# Patient Record
Sex: Female | Born: 1995 | Race: White | Hispanic: No | Marital: Single | Smoking: Never smoker
Health system: Southern US, Community
[De-identification: ages and names within clinical notes are randomized; demographics above are authoritative.]

---

## 2018-01-14 ENCOUNTER — Encounter (HOSPITAL_COMMUNITY): Payer: Self-pay | Admitting: Emergency Medicine

## 2018-01-14 ENCOUNTER — Emergency Department (HOSPITAL_COMMUNITY)
Admission: EM | Admit: 2018-01-14 | Discharge: 2018-01-15 | Disposition: A | Discharge status: Home / Self Care | Payer: BLUE CROSS/BLUE SHIELD | Attending: Emergency Medicine | Admitting: Emergency Medicine

## 2018-01-14 ENCOUNTER — Other Ambulatory Visit: Payer: Self-pay

## 2018-01-14 DIAGNOSIS — R102 Pelvic and perineal pain: Secondary | ICD-10-CM | POA: Insufficient documentation

## 2018-01-14 DIAGNOSIS — R1030 Lower abdominal pain, unspecified: Secondary | ICD-10-CM

## 2018-01-14 DIAGNOSIS — R109 Unspecified abdominal pain: Secondary | ICD-10-CM | POA: Diagnosis present

## 2018-01-14 LAB — URINALYSIS, ROUTINE W REFLEX MICROSCOPIC
BILIRUBIN URINE: NEGATIVE
Glucose, UA: NEGATIVE mg/dL
Hgb urine dipstick: NEGATIVE
KETONES UR: NEGATIVE mg/dL
Leukocytes, UA: NEGATIVE
NITRITE: NEGATIVE
PH: 8 (ref 5.0–8.0)
Protein, ur: NEGATIVE mg/dL
Specific Gravity, Urine: 1.012 (ref 1.005–1.030)

## 2018-01-14 LAB — COMPREHENSIVE METABOLIC PANEL
ALBUMIN: 4.3 g/dL (ref 3.5–5.0)
ALK PHOS: 69 U/L (ref 38–126)
ALT: 10 U/L — AB (ref 14–54)
AST: 18 U/L (ref 15–41)
Anion gap: 11 (ref 5–15)
BILIRUBIN TOTAL: 0.9 mg/dL (ref 0.3–1.2)
BUN: 10 mg/dL (ref 6–20)
CALCIUM: 9 mg/dL (ref 8.9–10.3)
CO2: 23 mmol/L (ref 22–32)
CREATININE: 0.67 mg/dL (ref 0.44–1.00)
Chloride: 104 mmol/L (ref 101–111)
GFR calc Af Amer: >60 mL/min (ref >60)
GFR calc non Af Amer: >60 mL/min (ref >60)
GLUCOSE: 113 mg/dL — AB (ref 65–99)
POTASSIUM: 3.5 mmol/L (ref 3.5–5.1)
Sodium: 138 mmol/L (ref 135–145)
TOTAL PROTEIN: 6.8 g/dL (ref 6.5–8.1)

## 2018-01-14 LAB — CBC
HCT: 38.6 % (ref 36.0–46.0)
Hemoglobin: 13.5 g/dL (ref 12.0–15.0)
MCH: 30.7 pg (ref 26.0–34.0)
MCHC: 35 g/dL (ref 30.0–36.0)
MCV: 87.7 fL (ref 78.0–100.0)
PLATELETS: 217 10*3/uL (ref 150–400)
RBC: 4.4 MIL/uL (ref 3.87–5.11)
RDW: 12 % (ref 11.5–15.5)
WBC: 7.2 10*3/uL (ref 4.0–10.5)

## 2018-01-14 LAB — I-STAT BETA HCG BLOOD, ED (MC, WL, AP ONLY): I-stat hCG, quantitative: <5 m[IU]/mL (ref <5)

## 2018-01-14 LAB — LIPASE, BLOOD: Lipase: 35 U/L (ref 11–51)

## 2018-01-14 NOTE — ED Triage Notes (Signed)
Pt reports generalized sharp abdominal pain x2 hours. Denies N/V/D.

## 2018-01-15 ENCOUNTER — Emergency Department (HOSPITAL_COMMUNITY): Payer: BLUE CROSS/BLUE SHIELD

## 2018-01-15 LAB — RPR: RPR: NONREACTIVE

## 2018-01-15 MED ORDER — SODIUM CHLORIDE 0.9 % IV BOLUS (SEPSIS)
1000.0000 mL | Freq: Once | INTRAVENOUS | Status: AC
  Administered 2018-01-15: 1000 mL via INTRAVENOUS

## 2018-01-15 MED ORDER — MORPHINE SULFATE (PF) 4 MG/ML IV SOLN
4.0000 mg | Freq: Once | INTRAVENOUS | Status: AC
  Administered 2018-01-15: 4 mg via INTRAVENOUS
  Filled 2018-01-15: qty 1

## 2018-01-15 MED ORDER — ONDANSETRON HCL 4 MG/2ML IJ SOLN
4.0000 mg | Freq: Once | INTRAMUSCULAR | Status: AC
  Administered 2018-01-15: 4 mg via INTRAVENOUS
  Filled 2018-01-15: qty 2
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
## 2018-01-15 NOTE — ED Notes (Signed)
Patient transported to Ultrasound 

## 2018-01-15 NOTE — Discharge Instructions (Signed)
Return if pain is getting worse. °

## 2018-01-16 LAB — HIV ANTIBODY (ROUTINE TESTING W REFLEX): HIV Screen 4th Generation wRfx: NONREACTIVE

## 2019-04-05 IMAGING — US US ART/VEN ABD/PELV/SCROTUM DOPPLER LTD
1 series · 14 of 25 positions shown · non-contrast
Comparison: None.

CLINICAL DATA: Pelvic pain tonight.

EXAM:
TRANSABDOMINAL ULTRASOUND OF PELVIS
DOPPLER ULTRASOUND OF OVARIES
TECHNIQUE: Transabdominal ultrasound examination of the pelvis was performed
including evaluation of the uterus, ovaries, adnexal regions, and
pelvic cul-de-sac.
Color and duplex Doppler ultrasound was utilized to evaluate blood
flow to the ovaries.

[Series 1: us art/ven abd/pelv/scrotum doppler ltd · 0.24mm/px · 42 acquisitions, 14 frames shown]
[im 1/42]
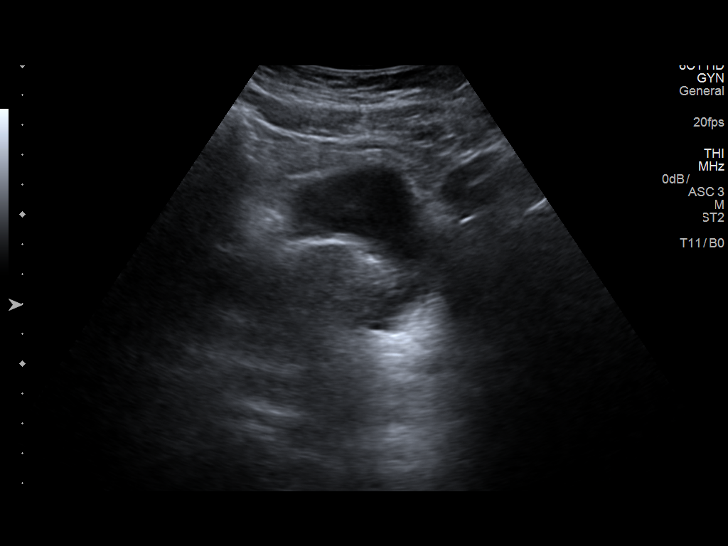
[im 4/42]
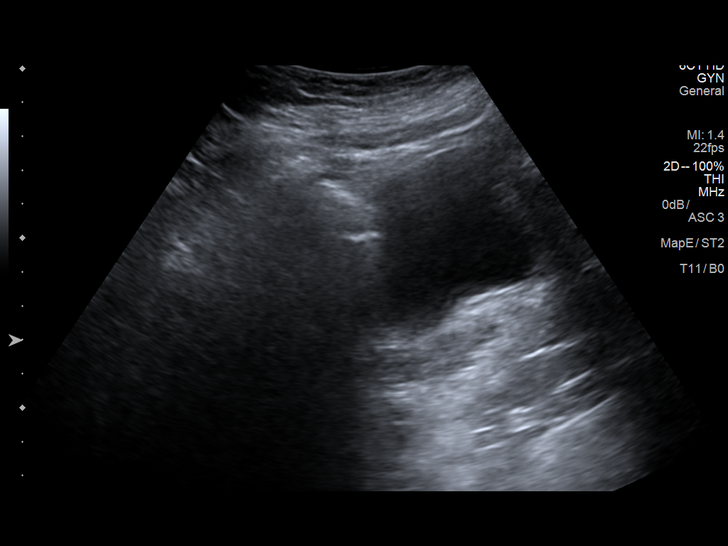
[im 7/42]
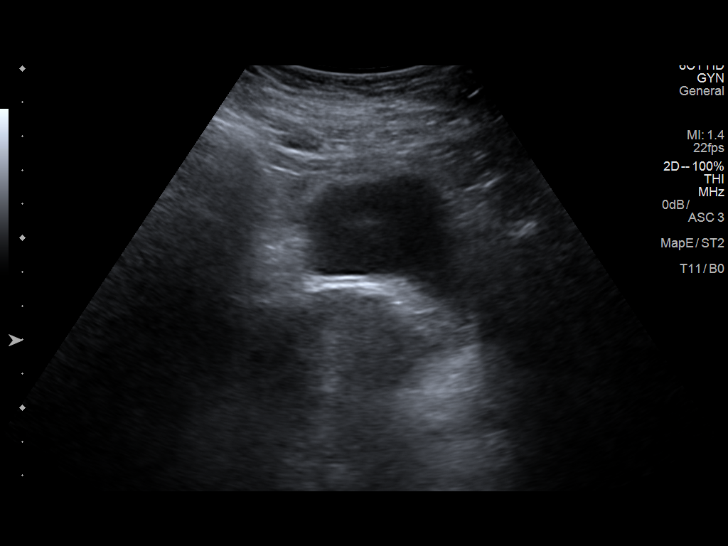
[im 11/42]
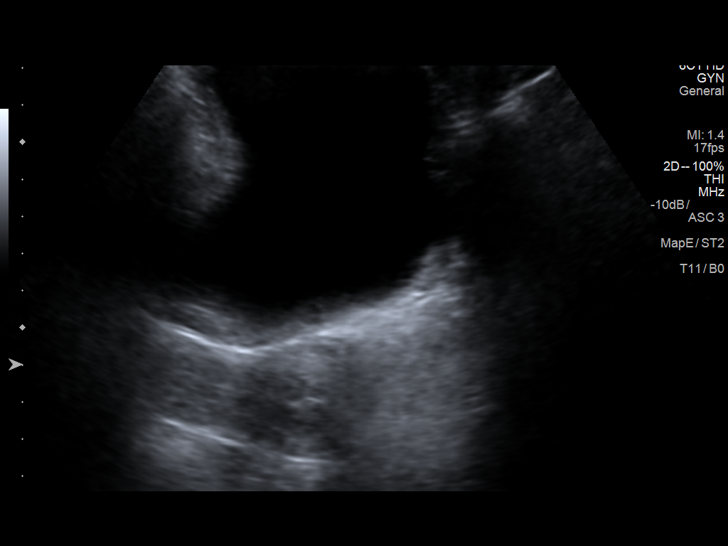
[im 14/42]
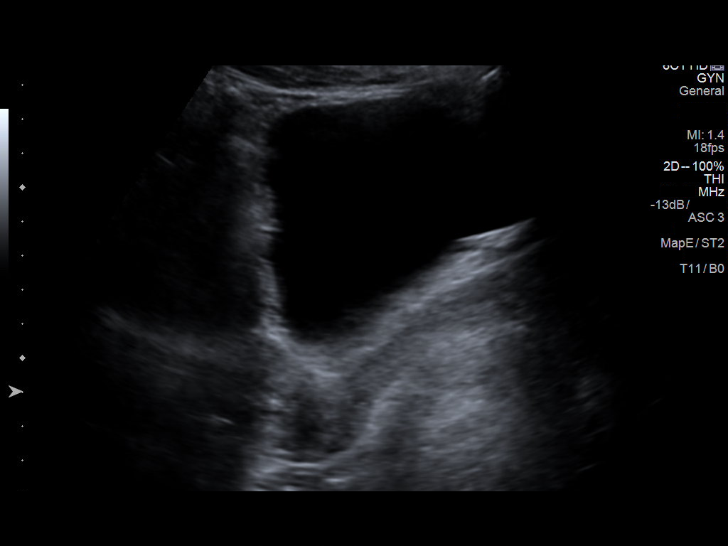
[im 16/42]
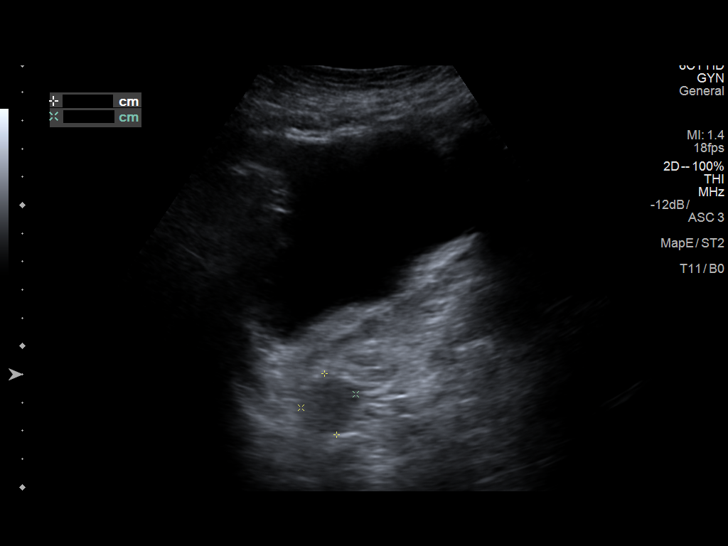
[im 19/42]
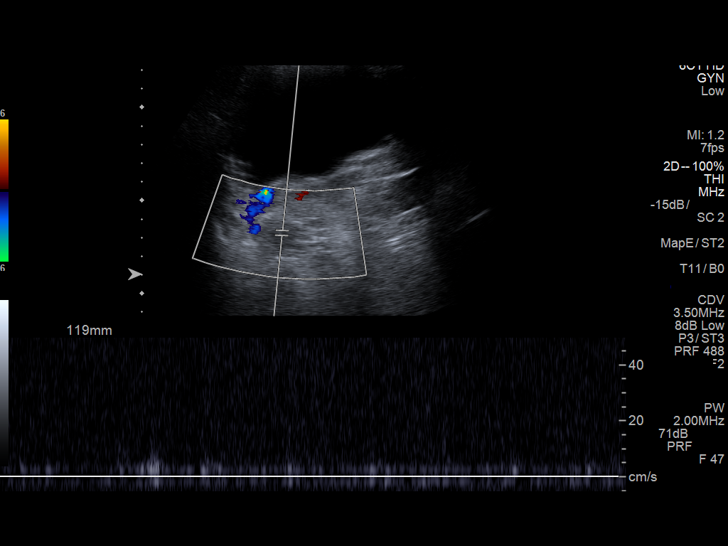
[im 23/42]
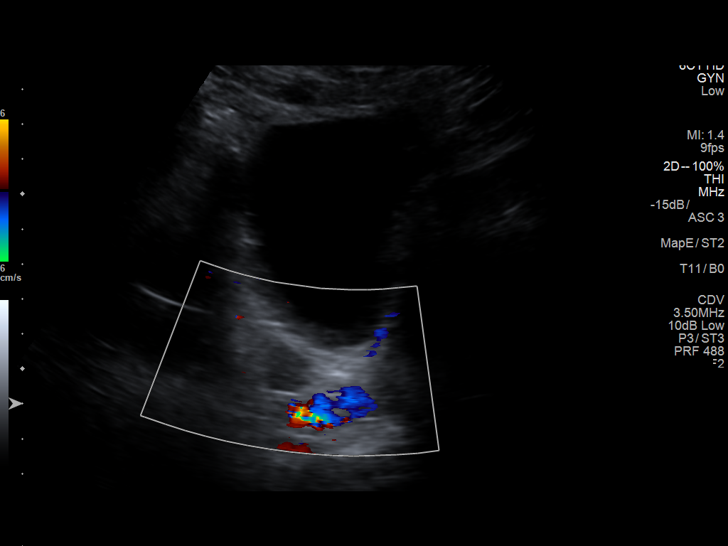
[im 26/42]
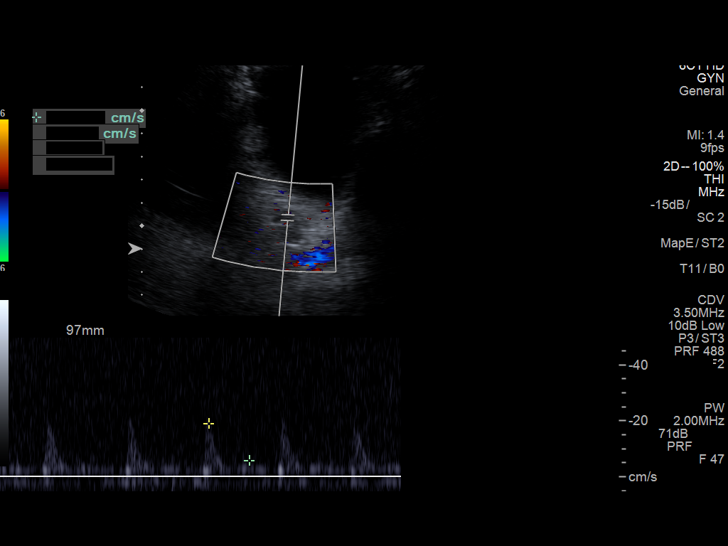
[im 28/42]
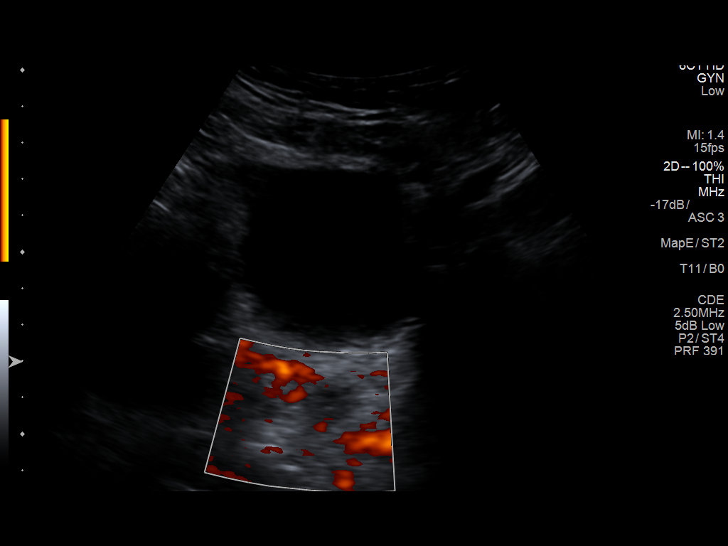
[im 31/42]
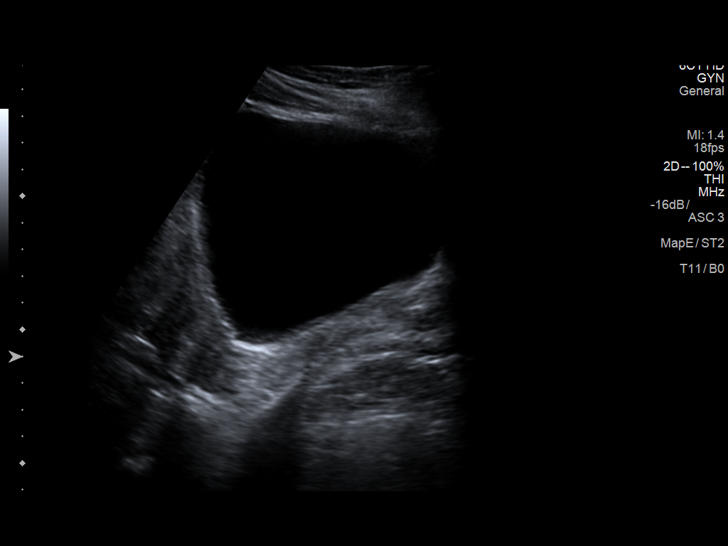
[im 35/42]
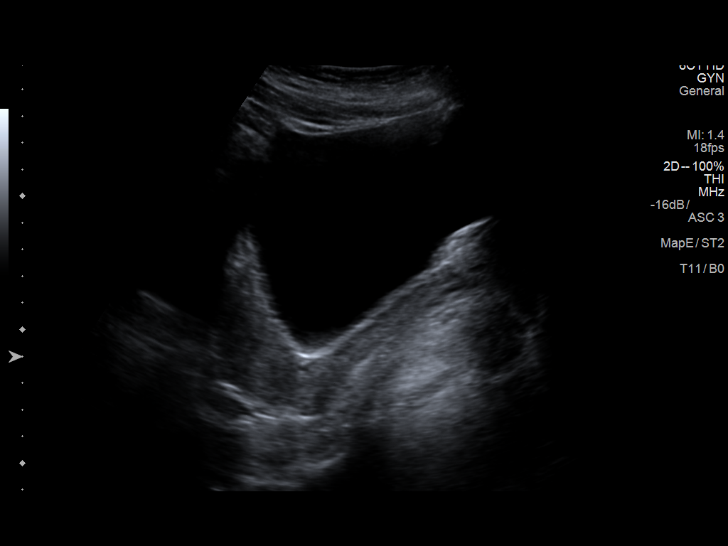
[im 38/42]
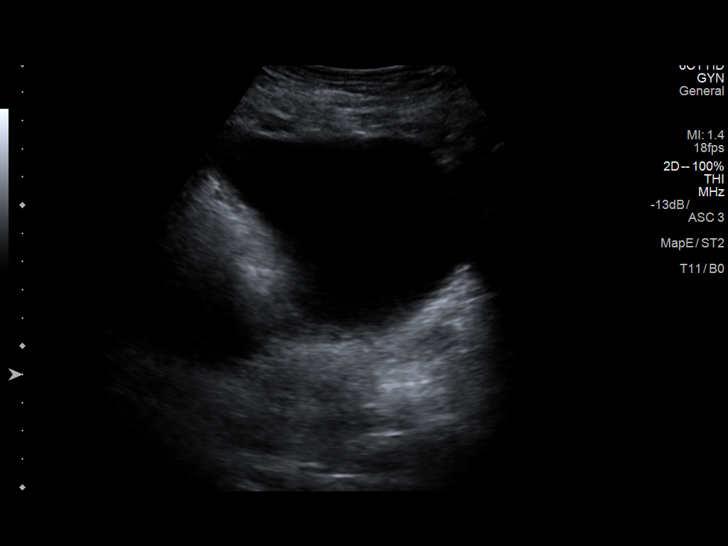
[im 42/42]
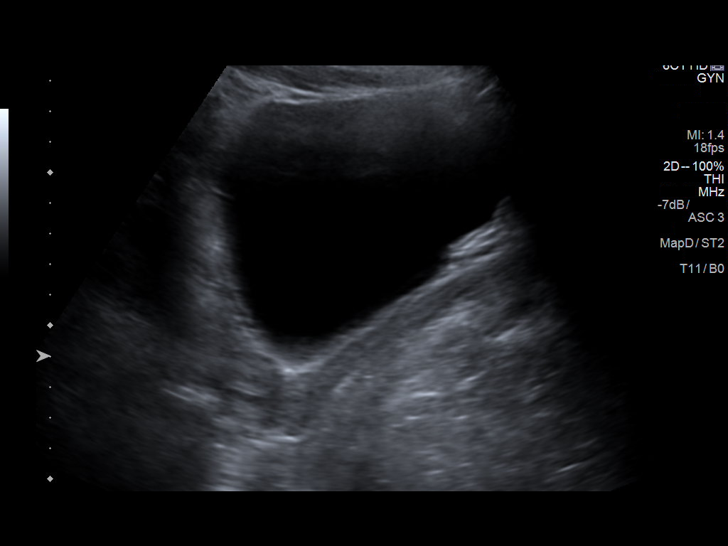

[14 of 25 positions shown; findings below may reference images not displayed]

FINDINGS: Uterus

Measurements: 5.3 x 3 x 5.1 cm. No fibroids or other mass
visualized.

Endometrium

Thickness: 5 mm.  No focal abnormality visualized.

Right ovary

Measurements: 2.2 x 2 x 2.5 cm.  Normal appearance/no adnexal mass.

Left ovary

Measurements: 1.8 x 1.5 x 2.2 cm. Normal appearance/no adnexal mass.

Pulsed Doppler evaluation demonstrates normal low-resistance
arterial and venous waveforms in both ovaries.
IMPRESSION: Normal ultrasound appearance of the uterus and ovaries. No evidence
of abnormal ovarian mass or torsion.
